# Patient Record
Sex: Male | Born: 1995 | Race: Black or African American | Hispanic: No | Marital: Single | Smoking: Never smoker
Health system: Southern US, Community
[De-identification: ages and names within clinical notes are randomized; demographics above are authoritative.]

## PROBLEM LIST (undated history)

## (undated) HISTORY — PX: ABDOMINAL SURGERY: SHX537

---
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
## 2014-07-28 ENCOUNTER — Encounter: Payer: Medicaid Other | Admitting: Family Medicine

## 2014-08-31 ENCOUNTER — Ambulatory Visit (INDEPENDENT_AMBULATORY_CARE_PROVIDER_SITE_OTHER): Payer: Medicaid Other | Admitting: Family Medicine

## 2014-08-31 VITALS — BP 131/81 | HR 72 | Temp 98.4°F | Ht 72.0 in | Wt 180.4 lb

## 2014-08-31 DIAGNOSIS — R21 Rash and other nonspecific skin eruption: Secondary | ICD-10-CM

## 2014-08-31 MED ORDER — TERBINAFINE HCL 1 % EX CREA: 1.0000 "application " | TOPICAL_CREAM | Freq: Two times a day (BID) | CUTANEOUS | Status: AC

## 2014-08-31 NOTE — Progress Notes (Signed)
   Subjective:    Dennis Schneider - 19 y.o. male MRN [id number]  Date of birth: [birth year]  HPI  SALIF TAY is here for rash.   Occurring on the left side of his neck.  Has been present for about a month.  Thinks it may have gotten bigger.  No pain or itching.  Denies fever, chills, or nigh sweats. .  Health Maintenance:  Health Maintenance Due  Topic Date Due  . HIV Screening  03/13/2009  . TETANUS/TDAP  03/13/2013  . INFLUENZA VACCINE  08/28/2014    -  reports that he has never smoked. He does not have any smokeless tobacco history on file. - Review of Systems: Per HPI. All other systems reviewed and are negative. - Past Medical History: Patient Active Problem List   Diagnosis Date Noted  . Elbow pain 03/24/2012  . Shin splints 02/16/2012  . Allergic conjunctivitis 04/30/2010  . ACNE VULGARIS 11/21/2009  . ADHD 04/17/2006   - Medications: reviewed and updated Current Outpatient Prescriptions  Medication Sig Dispense Refill  . clindamycin-benzoyl peroxide (BENZACLIN) gel Apply topically at bedtime. 50 g 1   No current facility-administered medications for this visit.     Review of Systems See HPI     Objective:   Physical Exam BP 131/81 mmHg  Pulse 72  Temp(Src) 98.4 F (36.9 C) (Oral)  Ht 6' (1.829 m)  Wt 180 lb 6.4 oz (81.829 kg)  BMI 24.46 kg/m2 Gen: NAD, alert, cooperative with exam, well-appearing Skin: macular, scaly oval spots on left trapezius,  Hypopigmentation on back and chest      Assessment & Plan:

## 2014-08-31 NOTE — Patient Instructions (Signed)
Thank you for coming in,   I have sent in some anti-fungal cream for the spot.   Please bring all of your medications with you to each visit.    Please feel free to call with any questions or concerns at any time, at [phone]. --Dr. Jordan Likes

## 2014-09-01 DIAGNOSIS — R21 Rash and other nonspecific skin eruption: Secondary | ICD-10-CM | POA: Insufficient documentation

## 2014-09-01 NOTE — Assessment & Plan Note (Signed)
Most likely fungal in nature by exam.  - lamisil cream.  - f/u if no improvement, may need to do a scrapping

## 2014-11-20 ENCOUNTER — Encounter: Payer: Self-pay | Admitting: Family Medicine

## 2014-11-20 ENCOUNTER — Ambulatory Visit (INDEPENDENT_AMBULATORY_CARE_PROVIDER_SITE_OTHER): Payer: Medicaid Other | Admitting: Family Medicine

## 2014-11-20 VITALS — BP 128/82 | HR 86 | Temp 98.3°F | Ht 72.0 in | Wt 180.0 lb

## 2014-11-20 DIAGNOSIS — N529 Male erectile dysfunction, unspecified: Secondary | ICD-10-CM | POA: Diagnosis present

## 2014-11-20 DIAGNOSIS — Z23 Encounter for immunization: Secondary | ICD-10-CM

## 2014-11-20 LAB — CBC
HCT: 43.4 % (ref 39.0–52.0)
Hemoglobin: 14.7 g/dL (ref 13.0–17.0)
MCH: 27.8 pg (ref 26.0–34.0)
MCHC: 33.9 g/dL (ref 30.0–36.0)
MCV: 82.2 fL (ref 78.0–100.0)
MPV: 9.8 fL (ref 8.6–12.4)
Platelets: 235 10*3/uL (ref 150–400)
RBC: 5.28 MIL/uL (ref 4.22–5.81)
RDW: 14.2 % (ref 11.5–15.5)
WBC: 4.3 10*3/uL (ref 4.0–10.5)

## 2014-11-20 LAB — POCT URINALYSIS DIPSTICK
Bilirubin, UA: NEGATIVE
Blood, UA: NEGATIVE
Glucose, UA: NEGATIVE
KETONES UA: NEGATIVE
LEUKOCYTES UA: NEGATIVE
NITRITE UA: NEGATIVE
PROTEIN UA: NEGATIVE
Spec Grav, UA: 1.025
Urobilinogen, UA: 0.2
pH, UA: 7

## 2014-11-20 LAB — TSH: TSH: 0.928 u[IU]/mL (ref 0.350–4.500)

## 2014-11-20 NOTE — Progress Notes (Signed)
   Subjective:    Dennis Schneider - 19 y.o. male MRN [id number]  Date of birth: 21-Mar-1995  HPI  Dennis Schneider is here for ED.  ED:  Started in August  reports that is doesn't achieve full erection  Working more than normal.  Denies any premature or prior history  No smoking or marijuana  Has morning erections  Has some anxiety prior to engaging in intercourse.  Not currently taking any medications.  No hx of STD or trauma or injury  Last time sexually active was march or Feb. Endorses ejactuation.  Denies any concerns regarding his relationship  No concerns with his partner being faithful  Has some arguments but nothing out of the ordinary  Does have some extra stressors as he is in school and working.    Health Maintenance:  Health Maintenance Due  Topic Date Due  . HIV Screening  03/13/2009  . TETANUS/TDAP  03/13/2013    -  reports that he has never smoked. He does not have any smokeless tobacco history on file. - Review of Systems: Per HPI. - Past Medical History: Patient Active Problem List   Diagnosis Date Noted  . ED (erectile dysfunction) 11/21/2014  . Rash and nonspecific skin eruption 09/01/2014  . ACNE VULGARIS 11/21/2009  . ADHD 04/17/2006   - Medications: reviewed and updated Current Outpatient Prescriptions  Medication Sig Dispense Refill  . clindamycin-benzoyl peroxide (BENZACLIN) gel Apply topically at bedtime. 50 g 1  . terbinafine (LAMISIL) 1 % cream Apply 1 application topically 2 (two) times daily. 30 g 0   No current facility-administered medications for this visit.     Review of Systems See HPI     Objective:   Physical Exam BP 128/82 mmHg  Pulse 86  Temp(Src) 98.3 F (36.8 C) (Oral)  Ht 6' (1.829 m)  Wt 180 lb (81.647 kg)  BMI 24.41 kg/m2 Gen: NAD, alert, cooperative with exam, well-appearing CV: RRR, good S1/S2,  Abd: SNTND, BS present, no guarding or organomegalyur,  GU: tanner stage V, normal external genitalia, normal  cremasteric reflex.  Skin: no rashes, normal turgor  Neuro: no gross deficits.  Psych: alert and oriented   PHQ-9: 6 GAD-7: 9  Assessment & Plan:   ED (erectile dysfunction) Most likely related to performance anxiety.  Doubtful for an organic cause  Otherwise healthy male  GAD-7 may indicate underlying anxiety d/o  - will perform work up and if all normal I have offered integrated care services with behavioral health.  He will call if he wants to pursue this.  - labs and UA. Will need to repeat urine cytology

## 2014-11-20 NOTE — Patient Instructions (Signed)
Thank you for coming in,   I will call you with the results from today.   The results will help determine our strategy.   Please bring all of your medications with you to each visit.   Sign up for My Chart to have easy access to your labs results, and communication with your Primary care physician   Please feel free to call with any questions or concerns at any time, at [phone]. --Dr. Jordan LikesSchmitz

## 2014-11-21 DIAGNOSIS — N529 Male erectile dysfunction, unspecified: Secondary | ICD-10-CM | POA: Insufficient documentation

## 2014-11-21 LAB — COMPREHENSIVE METABOLIC PANEL
ALK PHOS: 66 U/L (ref 40–115)
ALT: 23 U/L (ref 9–46)
AST: 21 U/L (ref 10–40)
Albumin: 4.7 g/dL (ref 3.6–5.1)
BUN: 16 mg/dL (ref 7–25)
CALCIUM: 10 mg/dL (ref 8.6–10.3)
CO2: 27 mmol/L (ref 20–31)
Chloride: 102 mmol/L (ref 98–110)
Creat: 0.98 mg/dL (ref 0.60–1.35)
Glucose, Bld: 85 mg/dL (ref 65–99)
POTASSIUM: 4.3 mmol/L (ref 3.5–5.3)
Sodium: 138 mmol/L (ref 135–146)
TOTAL PROTEIN: 7.5 g/dL (ref 6.1–8.1)
Total Bilirubin: 0.8 mg/dL (ref 0.2–1.2)

## 2014-11-21 LAB — LIPID PANEL
CHOL/HDL RATIO: 3.1 ratio (ref <=5.0)
CHOLESTEROL: 195 mg/dL — AB (ref 125–170)
HDL: 62 mg/dL (ref >=40)
LDL Cholesterol: 119 mg/dL — ABNORMAL HIGH (ref <110)
Triglycerides: 70 mg/dL (ref <150)
VLDL: 14 mg/dL (ref <30)

## 2014-11-21 NOTE — Assessment & Plan Note (Signed)
Most likely related to performance anxiety.  Doubtful for an organic cause  Otherwise healthy male  GAD-7 may indicate underlying anxiety d/o  - will perform work up and if all normal I have offered integrated care services with behavioral health.  He will call if he wants to pursue this.  - labs and UA. Will need to repeat urine cytology

## 2014-11-23 ENCOUNTER — Telehealth: Payer: Self-pay | Admitting: Family Medicine

## 2014-11-23 NOTE — Telephone Encounter (Signed)
Informed patient of labs results.   Dennis RudeJeremy E Schmitz, MD PGY-3, [facility] 11/23/2014, 2:15 PM

## 2014-11-27 ENCOUNTER — Other Ambulatory Visit: Payer: Medicaid Other

## 2015-03-10 ENCOUNTER — Telehealth: Payer: Self-pay | Admitting: Family Medicine

## 2015-03-10 NOTE — Telephone Encounter (Signed)
EMERGENCY PHONE LINE:  Notes two day history of coughing, rhinorrhea, sneezing, nasal congestion. Denies fevers, body aches, headaches. A lot of sick contacts with similar symptoms, but unsure if any are diagnosed with the flu. Had flu shot. Discussed that this is most likely viral etiology and antibiotics are not recommended. Should start to resolve on its own with symptomatic management. Flu unlikely given lack of fevers, body aches, headaches, but discussed that testing for flu is available. To follow up if no improvement.  Dr. Caroleen Hamman 03/10/15, 8:28 AM

## 2016-06-04 ENCOUNTER — Ambulatory Visit (HOSPITAL_COMMUNITY)
Admission: EM | Admit: 2016-06-04 | Discharge: 2016-06-04 | Disposition: A | Discharge status: Other Acute Inpatient Hospital | Payer: Medicaid Other

## 2016-06-04 ENCOUNTER — Emergency Department (HOSPITAL_COMMUNITY)
Admission: EM | Admit: 2016-06-04 | Discharge: 2016-06-04 | Disposition: A | Discharge status: Home / Self Care | Payer: Self-pay | Attending: Emergency Medicine | Admitting: Emergency Medicine

## 2016-06-04 ENCOUNTER — Encounter (HOSPITAL_COMMUNITY): Payer: Self-pay | Admitting: Emergency Medicine

## 2016-06-04 DIAGNOSIS — R112 Nausea with vomiting, unspecified: Secondary | ICD-10-CM | POA: Insufficient documentation

## 2016-06-04 DIAGNOSIS — R197 Diarrhea, unspecified: Secondary | ICD-10-CM | POA: Insufficient documentation

## 2016-06-04 DIAGNOSIS — Z79899 Other long term (current) drug therapy: Secondary | ICD-10-CM | POA: Insufficient documentation

## 2016-06-04 DIAGNOSIS — F909 Attention-deficit hyperactivity disorder, unspecified type: Secondary | ICD-10-CM | POA: Insufficient documentation

## 2016-06-04 LAB — COMPREHENSIVE METABOLIC PANEL
ALK PHOS: 67 U/L (ref 38–126)
ALT: 28 U/L (ref 17–63)
ANION GAP: 8 (ref 5–15)
AST: 27 U/L (ref 15–41)
Albumin: 4.5 g/dL (ref 3.5–5.0)
BILIRUBIN TOTAL: 0.7 mg/dL (ref 0.3–1.2)
BUN: 16 mg/dL (ref 6–20)
CALCIUM: 10 mg/dL (ref 8.9–10.3)
CO2: 26 mmol/L (ref 22–32)
Chloride: 105 mmol/L (ref 101–111)
Creatinine, Ser: 1.04 mg/dL (ref 0.61–1.24)
GLUCOSE: 100 mg/dL — AB (ref 65–99)
POTASSIUM: 4.1 mmol/L (ref 3.5–5.1)
Sodium: 139 mmol/L (ref 135–145)
TOTAL PROTEIN: 7.5 g/dL (ref 6.5–8.1)

## 2016-06-04 LAB — CBC
HEMATOCRIT: 43.2 % (ref 39.0–52.0)
HEMOGLOBIN: 14.3 g/dL (ref 13.0–17.0)
MCH: 27.6 pg (ref 26.0–34.0)
MCHC: 33.1 g/dL (ref 30.0–36.0)
MCV: 83.2 fL (ref 78.0–100.0)
Platelets: 209 10*3/uL (ref 150–400)
RBC: 5.19 MIL/uL (ref 4.22–5.81)
RDW: 13.1 % (ref 11.5–15.5)
WBC: 5 10*3/uL (ref 4.0–10.5)

## 2016-06-04 LAB — LIPASE, BLOOD: Lipase: 32 U/L (ref 11–51)

## 2016-06-04 MED ORDER — DICYCLOMINE HCL 20 MG PO TABS: 20.0000 mg | ORAL_TABLET | Freq: Two times a day (BID) | ORAL | 0 refills | Status: AC

## 2016-06-04 MED ORDER — ONDANSETRON 4 MG PO TBDP
4.0000 mg | ORAL_TABLET | Freq: Once | ORAL | Status: AC
  Administered 2016-06-04: 4 mg via ORAL
  Filled 2016-06-04: qty 1

## 2016-06-04 MED ORDER — ONDANSETRON 4 MG PO TBDP: 4.0000 mg | ORAL_TABLET | Freq: Three times a day (TID) | ORAL | 0 refills | Status: AC | PRN

## 2016-06-04 NOTE — ED Notes (Signed)
ED Provider at bedside. 

## 2016-06-04 NOTE — ED Provider Notes (Signed)
[facility] Provider Note   CSN: [id number] Arrival date & time: 06/04/16  1039     History   Chief Complaint Chief Complaint  Patient presents with  . Abdominal Pain  . Diarrhea  . Emesis    HPI Dennis Schneider is a 21 y.o. male.  HPI    Dennis Schneider is a 21 y.o. male, patient with no pertinent past medical history presenting to the ED with Nausea, vomiting, and diarrhea beginning early this morning. Patient made hamburgers using an egg around 9pm last night. States he used an egg that was involved in a recall for salmonella. Began vomiting and diarrhea around 3:30 am this morning. Endorses three episodes of emesis and four instances of diarrhea. Took an antacid with no relief. Currently endorses minor nausea and overall "not feeling well."  Denies fever/chills, hematochezia/melena, abdominal pain, or any other complaints.     History reviewed. No pertinent past medical history.  Patient Active Problem List   Diagnosis Date Noted  . ED (erectile dysfunction) 11/21/2014  . Rash and nonspecific skin eruption 09/01/2014  . ACNE VULGARIS 11/21/2009  . ADHD 04/17/2006    Past Surgical History:  Procedure Laterality Date  . ABDOMINAL SURGERY         Home Medications    Prior to Admission medications   Medication Sig Start Date End Date Taking? Authorizing Provider  clindamycin-benzoyl peroxide (BENZACLIN) gel Apply topically at bedtime. 02/16/12   Garnetta Buddy, MD  dicyclomine (BENTYL) 20 MG tablet Take 1 tablet (20 mg total) by mouth 2 (two) times daily. 06/04/16   Meloni Hinz C, PA-C  ondansetron (ZOFRAN ODT) 4 MG disintegrating tablet Take 1 tablet (4 mg total) by mouth every 8 (eight) hours as needed for nausea or vomiting. 06/04/16   Charmel Pronovost C, PA-C  terbinafine (LAMISIL) 1 % cream Apply 1 application topically 2 (two) times daily. 08/31/14   Myra Rude, MD    Family History History reviewed. No pertinent family history.  Social  History Social History  Substance Use Topics  . Smoking status: Never Smoker  . Smokeless tobacco: Never Used  . Alcohol use Yes     Allergies   Patient has no known allergies.   Review of Systems Review of Systems  Constitutional: Negative for chills and fever.  Respiratory: Negative for shortness of breath.   Cardiovascular: Negative for chest pain.  Gastrointestinal: Positive for diarrhea, nausea and vomiting. Negative for abdominal pain and blood in stool.  All other systems reviewed and are negative.    Physical Exam Updated Vital Signs BP (!) 133/97 (BP Location: Right Arm)   Pulse (!) 57   Temp 97.8 F (36.6 C) (Oral)   Resp 16   Ht 5\' 7"  (1.702 m)   Wt 83.9 kg   SpO2 97%   BMI 28.98 kg/m   Physical Exam  Constitutional: He appears well-developed and well-nourished. No distress.  HENT:  Head: Normocephalic and atraumatic.  Eyes: Conjunctivae are normal.  Neck: Neck supple.  Cardiovascular: Normal rate, regular rhythm, normal heart sounds and intact distal pulses.   Pulmonary/Chest: Effort normal and breath sounds normal. No respiratory distress.  Abdominal: Soft. There is no tenderness. There is no guarding.  Musculoskeletal: He exhibits no edema.  Lymphadenopathy:    He has no cervical adenopathy.  Neurological: He is alert.  Skin: Skin is warm and dry. He is not diaphoretic.  Psychiatric: He has a normal mood and affect. His behavior is normal.  Nursing note and vitals reviewed.    ED Treatments / Results  Labs (all labs ordered are listed, but only abnormal results are displayed) Labs Reviewed  COMPREHENSIVE METABOLIC PANEL - Abnormal; Notable for the following:       Result Value   Glucose, Bld 100 (*)    All other components within normal limits  LIPASE, BLOOD  CBC  URINALYSIS, ROUTINE W REFLEX MICROSCOPIC    EKG  EKG Interpretation None       Radiology No results found.  Procedures Procedures (including critical care  time)  Medications Ordered in ED Medications  ondansetron (ZOFRAN-ODT) disintegrating tablet 4 mg (not administered)     Initial Impression / Assessment and Plan / ED Course  I have reviewed the triage vital signs and the nursing notes.  Pertinent labs & imaging results that were available during my care of the patient were reviewed by me and considered in my medical decision making (see chart for details).  Clinical Course as of Jun 04 1137  Wed Jun 04, 2016  1110 Initial patient contact attempted. Patient in restroom.  [SJ]    Clinical Course User Index [SJ] Kyrstin Campillo C, PA-C    Patient presents with nausea, vomiting, and diarrhea beginning this morning. Low suspicion for appendicitis based on exam and patient presentation. Able to tolerate PO. Pain-free in the ED. The patient was given instructions for home care as well as return precautions. Patient voices understanding of these instructions, accepts the plan, and is comfortable with discharge.      Final Clinical Impressions(s) / ED Diagnoses   Final diagnoses:  Nausea vomiting and diarrhea    New Prescriptions New Prescriptions   DICYCLOMINE (BENTYL) 20 MG TABLET    Take 1 tablet (20 mg total) by mouth 2 (two) times daily.   ONDANSETRON (ZOFRAN ODT) 4 MG DISINTEGRATING TABLET    Take 1 tablet (4 mg total) by mouth every 8 (eight) hours as needed for nausea or vomiting.     Anselm PancoastJoy, Flossie Wexler C, PA-C 06/04/16 1140    Rolland PorterJames, Mark, MD 06/13/16 [phone]

## 2016-06-04 NOTE — Discharge Instructions (Signed)
Your lab results showed no significant abnormalities. Please refer to the directions below.   Hand washing: Wash your hands throughout the day, but especially before and after touching the face, using the restroom, sneezing, coughing, or touching surfaces that have been coughed or sneezed upon. Hydration: Symptoms will be intensified and complicated by dehydration. Dehydration can also extend the duration of symptoms. Drink plenty of fluids and get plenty of rest. You should be drinking at least half a liter of water an hour to stay hydrated. Electrolyte drinks are also encouraged. You should be drinking enough fluids to make your urine light yellow, almost clear. If this is not the case, you are not drinking enough water. Please note that some of the treatments indicated below will not be effective if you are not adequately hydrated. Pain or fever: Ibuprofen, Naproxen, or Tylenol for pain or fever.  Nausea/vomiting: Use the Zofran for nausea or vomiting.  Abdominal cramping: Use the Bentyl, as needed, for abdominal cramping.  Follow up: Follow up with a primary care provider, as needed, for any future management of this issue. Return: If you begin to have abdominal pain located near the umbilicus or in the right lower region, continuous or uncontrolled vomiting, sustained fever, or overall worsening symptoms, return to the ED.

## 2016-06-04 NOTE — ED Triage Notes (Signed)
Pt concerned he has food poisoning. Pt used bad eggs yesterday to make burgers. Last night started having abd pain, N/V/diarrhea.

## 2022-08-08 ENCOUNTER — Other Ambulatory Visit: Payer: Self-pay

## 2022-08-08 ENCOUNTER — Ambulatory Visit
Admission: RE | Admit: 2022-08-08 | Discharge: 2022-08-08 | Disposition: A | Discharge status: Home / Self Care | Payer: BLUE CROSS/BLUE SHIELD | Source: Ambulatory Visit | Attending: Internal Medicine | Admitting: Internal Medicine

## 2022-08-08 VITALS — BP 135/89 | HR 80 | Temp 98.7°F | Resp 18 | Ht 71.0 in | Wt 243.0 lb

## 2022-08-08 DIAGNOSIS — H5789 Other specified disorders of eye and adnexa: Secondary | ICD-10-CM | POA: Diagnosis not present

## 2022-08-08 MED ORDER — ERYTHROMYCIN 5 MG/GM OP OINT: TOPICAL_OINTMENT | OPHTHALMIC | 0 refills | Status: AC

## 2022-08-08 NOTE — ED Provider Notes (Signed)
[facility]    CSN: [id number] Arrival date & time: 08/08/22  0846      History   Chief Complaint Chief Complaint  Patient presents with   Eye Problem    HPI Dennis Schneider is a 27 y.o. male.   Patient presents today with concerns of clogged tear duct.  Reports that he has been having some clear drainage from his eye for about 5 to 6 days.  Reports that he noticed that it became thicker discharge yesterday.  Denies trauma or foreign body to the eye.  Patient does not wear contacts or glasses.  Denies blurry vision.  Reports that he has also been having pain directly beside the nose in the corner of his eye and has had some swelling in the left upper eyelid.  States that he woke up this morning and his eye was crusted shut.   Eye Problem   History reviewed. No pertinent past medical history.  Patient Active Problem List   Diagnosis Date Noted   ED (erectile dysfunction) 11/21/2014   Rash and nonspecific skin eruption 09/01/2014   ACNE VULGARIS 11/21/2009   ADHD 04/17/2006    Past Surgical History:  Procedure Laterality Date   ABDOMINAL SURGERY         Home Medications    Prior to Admission medications   Medication Sig Start Date End Date Taking? Authorizing Provider  erythromycin ophthalmic ointment Place a 1/2 inch ribbon of ointment into the lower eyelid 4 times daily. 08/08/22  Yes Justeen Hehr, Rolly Salter E, FNP  clindamycin-benzoyl peroxide (BENZACLIN) gel Apply topically at bedtime. 02/16/12   Garnetta Buddy, MD  dicyclomine (BENTYL) 20 MG tablet Take 1 tablet (20 mg total) by mouth 2 (two) times daily. 06/04/16   Joy, Shawn C, PA-C  ondansetron (ZOFRAN ODT) 4 MG disintegrating tablet Take 1 tablet (4 mg total) by mouth every 8 (eight) hours as needed for nausea or vomiting. 06/04/16   Joy, Shawn C, PA-C  terbinafine (LAMISIL) 1 % cream Apply 1 application topically 2 (two) times daily. 08/31/14   Myra Rude, MD    Family History History reviewed. No  pertinent family history.  Social History Social History   Tobacco Use   Smoking status: Never   Smokeless tobacco: Never  Vaping Use   Vaping status: Every Day   Substances: Nicotine, Flavoring  Substance Use Topics   Alcohol use: Yes    Comment: Occassionally   Drug use: Yes    Types: Marijuana     Allergies   Patient has no known allergies.   Review of Systems Review of Systems Per HPI  Physical Exam Triage Vital Signs ED Triage Vitals  Encounter Vitals Group     BP 08/08/22 0901 135/89     Systolic BP Percentile --      Diastolic BP Percentile --      Pulse Rate 08/08/22 0901 80     Resp 08/08/22 0901 18     Temp 08/08/22 0901 98.7 F (37.1 C)     Temp Source 08/08/22 0901 Oral     SpO2 08/08/22 0901 98 %     Weight 08/08/22 0858 243 lb (110.2 kg)     Height 08/08/22 0858 5\' 11"  (1.803 m)     Head Circumference --      Peak Flow --      Pain Score 08/08/22 0858 2     Pain Loc --      Pain Education --  Exclude from Growth Chart --    No data found.  Updated Vital Signs BP 135/89 (BP Location: Left Arm)   Pulse 80   Temp 98.7 F (37.1 C) (Oral)   Resp 18   Ht 5\' 11"  (1.803 m)   Wt 243 lb (110.2 kg)   SpO2 98%   BMI 33.89 kg/m   Visual Acuity Right Eye Distance: 20/20 (Uncorrected) Left Eye Distance: 20/20 (Uncorrected) Bilateral Distance:    Right Eye Near:   Left Eye Near:    Bilateral Near:     Physical Exam Constitutional:      General: He is not in acute distress.    Appearance: Normal appearance. He is not toxic-appearing or diaphoretic.  HENT:     Head: Normocephalic and atraumatic.  Eyes:     General: Lids are everted, no foreign bodies appreciated. Vision grossly intact. Gaze aligned appropriately.     Extraocular Movements: Extraocular movements intact.     Conjunctiva/sclera: Conjunctivae normal.     Pupils: Pupils are equal, round, and reactive to light.      Comments: Has a mild amount of swelling with no erythema  present to portion of eye between left upper eyelid and nose.  Mild to moderate swelling present to lower eyelid as well in between eye and bridge of nose.  Mild purulent drainage noted.  Sclera appears normal.  Pulmonary:     Effort: Pulmonary effort is normal.  Neurological:     General: No focal deficit present.     Mental Status: He is alert and oriented to person, place, and time. Mental status is at baseline.  Psychiatric:        Mood and Affect: Mood normal.        Behavior: Behavior normal.        Thought Content: Thought content normal.        Judgment: Judgment normal.      UC Treatments / Results  Labs (all labs ordered are listed, but only abnormal results are displayed) Labs Reviewed - No data to display  EKG   Radiology No results found.  Procedures Procedures (including critical care time)  Medications Ordered in UC Medications - No data to display  Initial Impression / Assessment and Plan / UC Course  I have reviewed the triage vital signs and the nursing notes.  Pertinent labs & imaging results that were available during my care of the patient were reviewed by me and considered in my medical decision making (see chart for details).     I am suspicious of clogged tear duct.  Will treat with erythromycin and patient advised to gently massage the area.  Advised following up with ophthalmology at provided contact information as well.  Visual acuity appears normal.  Discussed strict return precautions.  Patient verbalized understanding and was agreeable with plan. Final Clinical Impressions(s) / UC Diagnoses   Final diagnoses:  Irritation of right eye     Discharge Instructions      I am suspicious that your tear duct could be clogged.  Recommend massaging it very gently around the area as we discussed.  And antibiotic ointment has been prescribed for you to place in your eye as well.  Follow-up with eye doctor at provided phone number.    ED  Prescriptions     Medication Sig Dispense Auth. Provider   erythromycin ophthalmic ointment Place a 1/2 inch ribbon of ointment into the lower eyelid 4 times daily. 3.5 g Gustavus Bryant, [place]  PDMP not reviewed this encounter.   Gustavus Bryant, [place] 08/08/22 [phone]

## 2022-08-08 NOTE — ED Triage Notes (Signed)
Poss Blocked Tear Duct - Entered by patient. Right Eye. No injury known. First noticed due to watery eye, irritation, yesterday.

## 2022-08-08 NOTE — Discharge Instructions (Addendum)
I am suspicious that your tear duct could be clogged.  Recommend massaging it very gently around the area as we discussed.  And antibiotic ointment has been prescribed for you to place in your eye as well.  Follow-up with eye doctor at provided phone number.
